# Patient Record
Sex: Female | Born: 1980 | Race: Black or African American | Hispanic: No | Marital: Single
Health system: Southern US, Community
[De-identification: ages and names within clinical notes are randomized; demographics above are authoritative.]

---

## 2020-09-11 ENCOUNTER — Other Ambulatory Visit: Payer: Self-pay

## 2020-09-11 ENCOUNTER — Emergency Department (HOSPITAL_COMMUNITY): Payer: HRSA Program

## 2020-09-11 ENCOUNTER — Emergency Department (HOSPITAL_COMMUNITY)
Admission: EM | Admit: 2020-09-11 | Discharge: 2020-09-11 | Disposition: A | Discharge status: Home / Self Care | Payer: HRSA Program | Attending: Emergency Medicine | Admitting: Emergency Medicine

## 2020-09-11 DIAGNOSIS — R002 Palpitations: Secondary | ICD-10-CM | POA: Insufficient documentation

## 2020-09-11 DIAGNOSIS — R55 Syncope and collapse: Secondary | ICD-10-CM | POA: Diagnosis not present

## 2020-09-11 DIAGNOSIS — U071 COVID-19: Secondary | ICD-10-CM | POA: Insufficient documentation

## 2020-09-11 DIAGNOSIS — R030 Elevated blood-pressure reading, without diagnosis of hypertension: Secondary | ICD-10-CM | POA: Insufficient documentation

## 2020-09-11 DIAGNOSIS — R197 Diarrhea, unspecified: Secondary | ICD-10-CM | POA: Diagnosis present

## 2020-09-11 LAB — CBC
HCT: 41.4 % (ref 36.0–46.0)
Hemoglobin: 13.4 g/dL (ref 12.0–15.0)
MCH: 29.5 pg (ref 26.0–34.0)
MCHC: 32.4 g/dL (ref 30.0–36.0)
MCV: 91 fL (ref 80.0–100.0)
Platelets: 323 10*3/uL (ref 150–400)
RBC: 4.55 MIL/uL (ref 3.87–5.11)
RDW: 13 % (ref 11.5–15.5)
WBC: 5.8 10*3/uL (ref 4.0–10.5)
nRBC: 0 % (ref 0.0–0.2)

## 2020-09-11 LAB — BASIC METABOLIC PANEL
Anion gap: 10 (ref 5–15)
BUN: 10 mg/dL (ref 6–20)
CO2: 25 mmol/L (ref 22–32)
Calcium: 9.4 mg/dL (ref 8.9–10.3)
Chloride: 104 mmol/L (ref 98–111)
Creatinine, Ser: 0.68 mg/dL (ref 0.44–1.00)
GFR, Estimated: >60 mL/min (ref >60)
Glucose, Bld: 108 mg/dL — ABNORMAL HIGH (ref 70–99)
Potassium: 3.5 mmol/L (ref 3.5–5.1)
Sodium: 139 mmol/L (ref 135–145)

## 2020-09-11 LAB — I-STAT BETA HCG BLOOD, ED (MC, WL, AP ONLY): I-stat hCG, quantitative: <5 m[IU]/mL (ref <5)

## 2020-09-11 LAB — TROPONIN I (HIGH SENSITIVITY): Troponin I (High Sensitivity): 3 ng/L (ref <18)

## 2020-09-11 NOTE — ED Provider Notes (Signed)
[facility] EMERGENCY DEPARTMENT Provider Note   CSN: [id number] Arrival date & time: 09/11/20  1828     History Chief Complaint  Patient presents with  . Near Syncope  . Palpitations    Jamie Douglas is a 40 y.o. female.  HPI   This patient is a 40 year old female who denies having any chronic medical problems.  She states that she was in her usual state of health until today when she went to work.  She states that she had about 4 episodes of watery diarrhea and then became lightheaded and dizzy feeling palpitations and near syncope.  She never passed out, she has been feeling a little bit of intermittent lightheadedness since that time and at this time is feeling better but still mildly symptomatic.  She still feels the occasional palpitations.  The patient denies any fevers, chills, nausea, vomiting, abdominal pain, chest pain, coughing or shortness of breath.  She has not lost taste or smell.  She has not had rashes or swelling and has not had dysuria hematuria or rectal bleeding.  She states that she did have a drink last night and it made her feel little bit weird, she has the occasional tingling in her chest but is not having it right now.  Otherwise she does not smoke cigarettes, she drinks occasional alcohol, she does not use any illegal drugs.  She does not use any energy drinks.  No past medical history on file.  There are no problems to display for this patient.      OB History   No obstetric history on file.     No family history on file.     Home Medications Prior to Admission medications   Not on File    Allergies    Patient has no allergy information on record.  Review of Systems   Review of Systems  All other systems reviewed and are negative.   Physical Exam Updated Vital Signs BP 121/68   Pulse 81   Temp 98.3 F (36.8 C) (Oral)   Resp 15   SpO2 97%   Physical Exam Vitals and nursing note reviewed.  Constitutional:       General: She is not in acute distress.    Appearance: She is well-developed and well-nourished.  HENT:     Head: Normocephalic and atraumatic.     Mouth/Throat:     Mouth: Oropharynx is clear and moist.     Pharynx: No oropharyngeal exudate.  Eyes:     General: No scleral icterus.       Right eye: No discharge.        Left eye: No discharge.     Extraocular Movements: EOM normal.     Conjunctiva/sclera: Conjunctivae normal.     Pupils: Pupils are equal, round, and reactive to light.  Neck:     Thyroid: No thyromegaly.     Vascular: No JVD.  Cardiovascular:     Rate and Rhythm: Normal rate.     Pulses: Intact distal pulses.     Heart sounds: Normal heart sounds. No murmur heard. No friction rub. No gallop.      Comments: Possible bigeminy Pulmonary:     Effort: Pulmonary effort is normal. No respiratory distress.     Breath sounds: Normal breath sounds. No wheezing or rales.  Abdominal:     General: Bowel sounds are normal. There is no distension.     Palpations: Abdomen is soft. There is no mass.  Tenderness: There is no abdominal tenderness.  Musculoskeletal:        General: No tenderness or edema. Normal range of motion.     Cervical back: Normal range of motion and neck supple.  Lymphadenopathy:     Cervical: No cervical adenopathy.  Skin:    General: Skin is warm and dry.     Findings: No erythema or rash.  Neurological:     Mental Status: She is alert.     Coordination: Coordination normal.  Psychiatric:        Mood and Affect: Mood and affect normal.        Behavior: Behavior normal.     ED Results / Procedures / Treatments   Labs (all labs ordered are listed, but only abnormal results are displayed) Labs Reviewed  SARS CORONAVIRUS 2 (TAT 6-24 HRS) - Abnormal; Notable for the following components:      Result Value   SARS Coronavirus 2 POSITIVE (*)    All other components within normal limits  BASIC METABOLIC PANEL - Abnormal; Notable for the following  components:   Glucose, Bld 108 (*)    All other components within normal limits  CBC  I-STAT BETA HCG BLOOD, ED ([facility], [facility], [facility] ONLY)  TROPONIN I (HIGH SENSITIVITY)    EKG EKG Interpretation  Date/Time:  Saturday September 11 2020 22:02:57 EST Ventricular Rate:  82 PR Interval:  144 QRS Duration: 81 QT Interval:  376 QTC Calculation: 440 R Axis:   70 Text Interpretation: Sinus rhythm Low voltage, precordial leads Normal ECG Since last tracing rate slower Confirmed by Eber Hong ([id number]) on 09/11/2020 10:47:42 PM Also confirmed by Eber Hong ([id number]), editor Elita Quick ([id number])  on 09/12/2020 8:43:41 AM   Radiology DG Chest 2 View  Result Date: 09/11/2020 CLINICAL DATA:  Upper central chest pain and dizziness with shortness of breath x4 hours. EXAM: CHEST - 2 VIEW COMPARISON:  None. FINDINGS: The heart size and mediastinal contours are within normal limits. Both lungs are clear. The visualized skeletal structures are unremarkable. Radiopaque surgical clips are seen within the right upper quadrant. IMPRESSION: No active cardiopulmonary disease. Electronically Signed   By: Aram Candela M.D.   On: 09/11/2020 19:10    Procedures Procedures (including critical care time)  Medications Ordered in ED Medications - No data to display  ED Course  I have reviewed the triage vital signs and the nursing notes.  Pertinent labs & imaging results that were available during my care of the patient were reviewed by me and considered in my medical decision making (see chart for details).    MDM Rules/Calculators/A&P                          This patient's exam is unremarkable, neurologically she is able to move all 4 extremities, she speaks clearly and has no facial droop.  She is able to answer all my questions, has normal heart sounds except that she does have what appears to be some ectopy with bigeminy clinically.  We will repeat an EKG as this was not seen on the initial EKG.   Pulses are strong abdomen soft lungs are clear vital signs are normal as are her lab work.  I will add a second EKG as well as a COVID test.  The patient is agreeable.  This may just be related to the diarrhea and general mild illness.  I have recommended rest for 24 hours with lots of hydration.  The patient did very well, labs show that she does not fact have COVID-19, this resulted after she had left.  She will be contacted to let her know this is positive.  Otherwise she has a very reassuring work-up and can follow-up in the outpatient setting  Jamie Douglas was evaluated in Emergency Department on 09/12/2020 for the symptoms described in the history of present illness. She was evaluated in the context of the global COVID-19 pandemic, which necessitated consideration that the patient might be at risk for infection with the SARS-CoV-2 virus that causes COVID-19. Institutional protocols and algorithms that pertain to the evaluation of patients at risk for COVID-19 are in a state of rapid change based on information released by regulatory bodies including the CDC and federal and state organizations. These policies and algorithms were followed during the patient's care in the ED.   Final Clinical Impression(s) / ED Diagnoses Final diagnoses:  Near syncope  Palpitations    Rx / DC Orders ED Discharge Orders    None       Eber Hong, MD 09/12/20 1510

## 2020-09-11 NOTE — ED Triage Notes (Signed)
Pt to triage via [facility] from work.  Feeling lightheaded, near syncope, blurred vision, headache, chest pain, and heart palpitations that started 2 hours ago.  ASA 324 mg.  CBG 104.  Also reports diarrhea.

## 2020-09-11 NOTE — Discharge Instructions (Addendum)
Your testing here did not show any specific abnormalities, this is very good news.  Please drink plenty of fluids, get plenty of rest, over the next 48 to 72 hours you should follow-up with your doctor or a heart doctor to obtain Holter monitor testing which will monitor your heart for the next several days or even longer.  If you should develop severe or worsening symptoms return to the emergency department immediately  If your COVID test comes back positive you will receive a phone call within the next 1 to 2 days

## 2020-09-12 LAB — SARS CORONAVIRUS 2 (TAT 6-24 HRS): SARS Coronavirus 2: POSITIVE — AB

## 2020-09-14 ENCOUNTER — Telehealth (HOSPITAL_COMMUNITY): Payer: Self-pay

## 2023-01-15 IMAGING — CR DG CHEST 2V
2 series · 2 of 2 positions shown · non-contrast
Comparison: None.

CLINICAL DATA: Upper central chest pain and dizziness with
shortness of breath x4 hours.

EXAM:
CHEST - 2 VIEW

[chest pa]
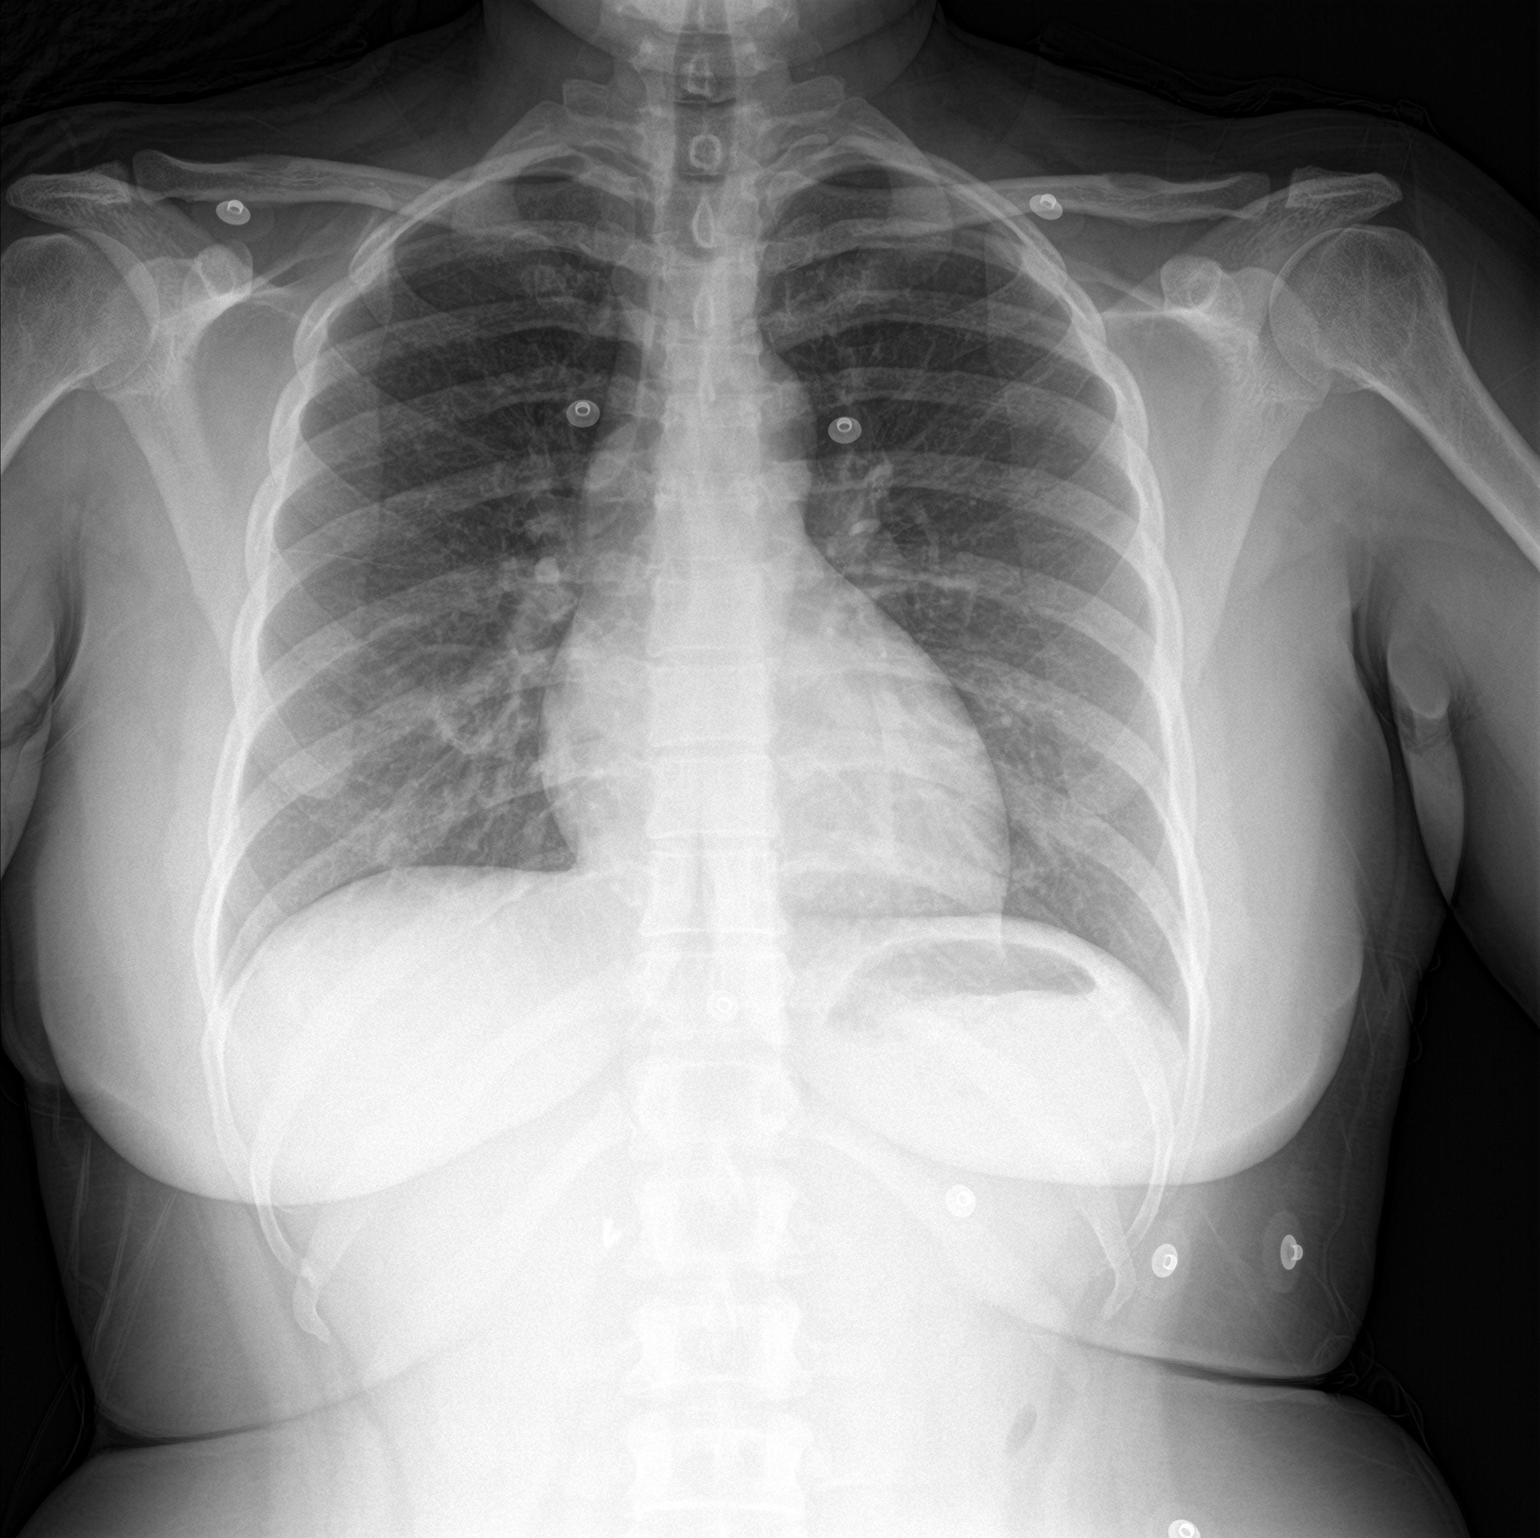

[chest lat]
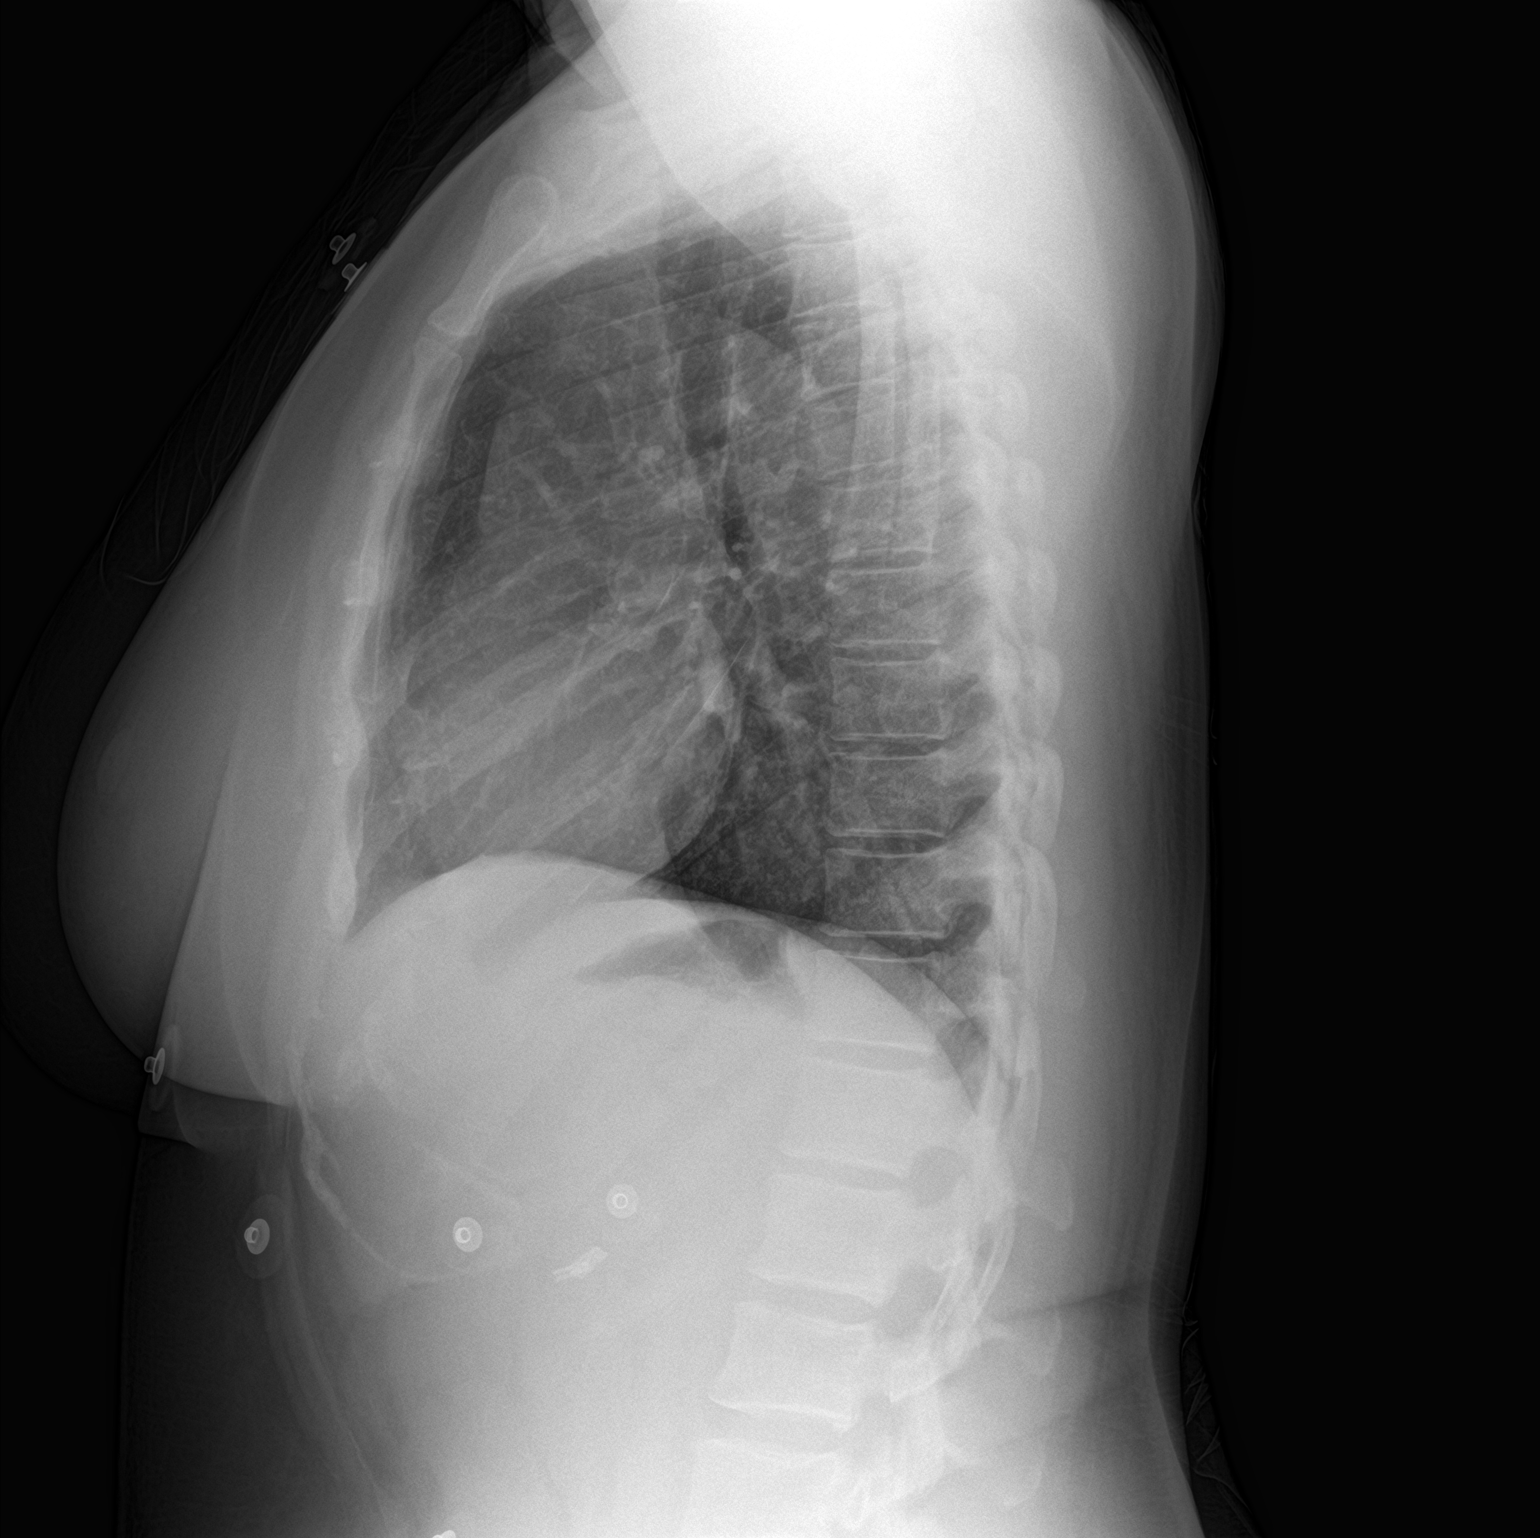

[2 of 2 positions shown; findings below may reference images not displayed]

FINDINGS: The heart size and mediastinal contours are within normal limits.
Both lungs are clear. The visualized skeletal structures are
unremarkable. Radiopaque surgical clips are seen within the right
upper quadrant.
IMPRESSION: No active cardiopulmonary disease.
